# Patient Record
Sex: Male | Born: 1990 | Race: Black or African American | Hispanic: No | Marital: Single | Smoking: Never smoker
Health system: Southern US, Community
[De-identification: ages and names within clinical notes are randomized; demographics above are authoritative.]

---

## 2020-09-19 ENCOUNTER — Emergency Department (HOSPITAL_COMMUNITY): Payer: Self-pay

## 2020-09-19 ENCOUNTER — Emergency Department (HOSPITAL_COMMUNITY)
Admission: EM | Admit: 2020-09-19 | Discharge: 2020-09-19 | Disposition: A | Discharge status: Home / Self Care | Payer: Self-pay | Attending: Emergency Medicine | Admitting: Emergency Medicine

## 2020-09-19 ENCOUNTER — Encounter (HOSPITAL_COMMUNITY): Payer: Self-pay | Admitting: Emergency Medicine

## 2020-09-19 ENCOUNTER — Other Ambulatory Visit: Payer: Self-pay

## 2020-09-19 DIAGNOSIS — S53114A Anterior dislocation of right ulnohumeral joint, initial encounter: Secondary | ICD-10-CM | POA: Insufficient documentation

## 2020-09-19 DIAGNOSIS — Z20822 Contact with and (suspected) exposure to covid-19: Secondary | ICD-10-CM | POA: Insufficient documentation

## 2020-09-19 DIAGNOSIS — S42401A Unspecified fracture of lower end of right humerus, initial encounter for closed fracture: Secondary | ICD-10-CM

## 2020-09-19 DIAGNOSIS — S53104A Unspecified dislocation of right ulnohumeral joint, initial encounter: Secondary | ICD-10-CM

## 2020-09-19 DIAGNOSIS — S52121A Displaced fracture of head of right radius, initial encounter for closed fracture: Secondary | ICD-10-CM | POA: Insufficient documentation

## 2020-09-19 LAB — CBC WITH DIFFERENTIAL/PLATELET
Abs Immature Granulocytes: 0.04 10*3/uL (ref 0.00–0.07)
Basophils Absolute: 0 10*3/uL (ref 0.0–0.1)
Basophils Relative: 0 %
Eosinophils Absolute: 0.1 10*3/uL (ref 0.0–0.5)
Eosinophils Relative: 1 %
HCT: 46.5 % (ref 39.0–52.0)
Hemoglobin: 15.4 g/dL (ref 13.0–17.0)
Immature Granulocytes: 0 %
Lymphocytes Relative: 17 %
Lymphs Abs: 2.1 10*3/uL (ref 0.7–4.0)
MCH: 29.6 pg (ref 26.0–34.0)
MCHC: 33.1 g/dL (ref 30.0–36.0)
MCV: 89.3 fL (ref 80.0–100.0)
Monocytes Absolute: 0.8 10*3/uL (ref 0.1–1.0)
Monocytes Relative: 6 %
Neutro Abs: 9.3 10*3/uL — ABNORMAL HIGH (ref 1.7–7.7)
Neutrophils Relative %: 76 %
Platelets: 327 10*3/uL (ref 150–400)
RBC: 5.21 MIL/uL (ref 4.22–5.81)
RDW: 13.8 % (ref 11.5–15.5)
WBC: 12.3 10*3/uL — ABNORMAL HIGH (ref 4.0–10.5)
nRBC: 0 % (ref 0.0–0.2)

## 2020-09-19 LAB — BASIC METABOLIC PANEL
Anion gap: 9 (ref 5–15)
BUN: 14 mg/dL (ref 6–20)
CO2: 27 mmol/L (ref 22–32)
Calcium: 9.8 mg/dL (ref 8.9–10.3)
Chloride: 103 mmol/L (ref 98–111)
Creatinine, Ser: 0.95 mg/dL (ref 0.61–1.24)
GFR, Estimated: >60 mL/min (ref >60)
Glucose, Bld: 89 mg/dL (ref 70–99)
Potassium: 3.9 mmol/L (ref 3.5–5.1)
Sodium: 139 mmol/L (ref 135–145)

## 2020-09-19 LAB — RESP PANEL BY RT-PCR (FLU A&B, COVID) ARPGX2
Influenza A by PCR: NEGATIVE
Influenza B by PCR: NEGATIVE
SARS Coronavirus 2 by RT PCR: NEGATIVE

## 2020-09-19 MED ORDER — PROPOFOL 10 MG/ML IV BOLUS
0.5000 mg/kg | Freq: Once | INTRAVENOUS | Status: DC
  Filled 2020-09-19: qty 20

## 2020-09-19 MED ORDER — MORPHINE SULFATE (PF) 4 MG/ML IV SOLN
4.0000 mg | Freq: Once | INTRAVENOUS | Status: AC
  Administered 2020-09-19: 4 mg via INTRAVENOUS
  Filled 2020-09-19: qty 1

## 2020-09-19 MED ORDER — OXYCODONE-ACETAMINOPHEN 5-325 MG PO TABS
1.0000 | ORAL_TABLET | Freq: Once | ORAL | Status: AC
Start: 2020-09-19 — End: 2020-09-19
  Administered 2020-09-19: 1 via ORAL
  Filled 2020-09-19: qty 1

## 2020-09-19 MED ORDER — PROPOFOL 10 MG/ML IV BOLUS
INTRAVENOUS | Status: AC | PRN
  Administered 2020-09-19: 50 mg via INTRAVENOUS

## 2020-09-19 MED ORDER — PROPOFOL 10 MG/ML IV BOLUS
0.5000 mg/kg | Freq: Once | INTRAVENOUS | Status: AC
  Administered 2020-09-19: 48.8 mg via INTRAVENOUS
  Filled 2020-09-19: qty 20

## 2020-09-19 MED ORDER — HYDROCODONE-ACETAMINOPHEN 5-325 MG PO TABS: 2.0000 | ORAL_TABLET | ORAL | 0 refills | Status: DC | PRN
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
## 2020-09-19 NOTE — ED Notes (Signed)
Propofol wasted with Karleen Hampshire, RN.

## 2020-09-19 NOTE — ED Notes (Signed)
Dr Rodena Medin, RRT, RN x 2 present for conscious sedation and right elbow reduction again. Time out completed. Pt gives consent again. Suction prepared, pt on cardiac and co2 monitor. Code cart and airway cart at bedside.
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
## 2020-09-19 NOTE — Progress Notes (Signed)
Orthopedic Tech Progress Note Patient Details:  Leopold Smyers [birth year] [id number]  Ortho Devices Type of Ortho Device: Shoulder immobilizer,Post (long arm) splint Ortho Device/Splint Location: RUE Ortho Device/Splint Interventions: Ordered,Application   Post Interventions Patient Tolerated: Well Instructions Provided: Adjustment of device,Care of device   Maurene Capes 09/19/2020, 6:29 PM

## 2020-09-19 NOTE — Discharge Instructions (Addendum)
Return for any problem.  ?

## 2020-09-24 ENCOUNTER — Other Ambulatory Visit: Payer: Self-pay

## 2020-09-24 ENCOUNTER — Emergency Department (HOSPITAL_COMMUNITY)
Admission: EM | Admit: 2020-09-24 | Discharge: 2020-09-24 | Disposition: A | Discharge status: Home / Self Care | Payer: Self-pay | Attending: Emergency Medicine | Admitting: Emergency Medicine

## 2020-09-24 ENCOUNTER — Emergency Department (HOSPITAL_COMMUNITY): Payer: Self-pay

## 2020-09-24 ENCOUNTER — Encounter (HOSPITAL_COMMUNITY): Payer: Self-pay

## 2020-09-24 DIAGNOSIS — S53104D Unspecified dislocation of right ulnohumeral joint, subsequent encounter: Secondary | ICD-10-CM | POA: Insufficient documentation

## 2020-09-24 DIAGNOSIS — Z4789 Encounter for other orthopedic aftercare: Secondary | ICD-10-CM | POA: Insufficient documentation

## 2020-09-24 DIAGNOSIS — X58XXXD Exposure to other specified factors, subsequent encounter: Secondary | ICD-10-CM | POA: Insufficient documentation
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
## 2020-09-24 NOTE — ED Notes (Signed)
Called ortho tech for long arm splint.

## 2020-09-24 NOTE — Progress Notes (Signed)
Orthopedic Tech Progress Note Patient Details:  Cory Peterson 12-01-1990 [id number]  Ortho Devices Type of Ortho Device: Long arm splint Ortho Device/Splint Location: Right Arm Ortho Device/Splint Interventions: Application   Post Interventions Patient Tolerated: Well   [name] [name] 09/24/2020, 1:43 PM

## 2020-09-24 NOTE — ED Triage Notes (Signed)
Pt states he removed his splint to right arm last night due to hurting him and don't follow up with orthopedist until Monday and needs splint re-applied.

## 2020-09-24 NOTE — Discharge Instructions (Addendum)
Follow up with Dr. [name]

## 2020-10-07 ENCOUNTER — Encounter (HOSPITAL_COMMUNITY): Payer: Self-pay | Admitting: Emergency Medicine

## 2020-10-07 ENCOUNTER — Other Ambulatory Visit: Payer: Self-pay

## 2020-10-07 ENCOUNTER — Emergency Department (HOSPITAL_COMMUNITY): Payer: Self-pay

## 2020-10-07 ENCOUNTER — Emergency Department (HOSPITAL_COMMUNITY)
Admission: EM | Admit: 2020-10-07 | Discharge: 2020-10-07 | Disposition: A | Discharge status: Home / Self Care | Payer: Self-pay | Attending: Emergency Medicine | Admitting: Emergency Medicine

## 2020-10-07 DIAGNOSIS — X500XXD Overexertion from strenuous movement or load, subsequent encounter: Secondary | ICD-10-CM | POA: Insufficient documentation

## 2020-10-07 DIAGNOSIS — S52181K Other fracture of upper end of right radius, subsequent encounter for closed fracture with nonunion: Secondary | ICD-10-CM

## 2020-10-07 DIAGNOSIS — S52121K Displaced fracture of head of right radius, subsequent encounter for closed fracture with nonunion: Secondary | ICD-10-CM | POA: Insufficient documentation

## 2020-10-07 NOTE — ED Notes (Signed)
An After Visit Summary was printed and given to the patient. Discharge instructions given and no further questions at this time.  Pt educated to go straight to Dr. Magdalene Patricia office from this ED. Pt verbalizes understanding. Harolyn Rutherford, PA in room, also informing pt he needs to go straight to Dr. Magdalene Patricia office.

## 2020-10-07 NOTE — Progress Notes (Signed)
Unable to reach patient via phone.  Left a detailed message on machine with instructions for DOS.  DUE TO COVID-19 ONLY ONE VISITOR IS ALLOWED TO COME WITH YOU AND STAY IN THE WAITING ROOM ONLY DURING PRE OP AND PROCEDURE DAY OF SURGERY.  PCP - unknown Cardiologist - n/a  Chest x-ray - n/a EKG - n/a Stress Test - n/a ECHO - n/a Cardiac Cath - n/a  STOP now taking any Aspirin (unless otherwise instructed by your surgeon), Aleve, Naproxen, Ibuprofen, Motrin, Advil, Goody's, BC's, all herbal medications, fish oil, and all vitamins.   Coronavirus Screening Covid test n/a - Ambulatory Surgery
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
## 2020-10-07 NOTE — ED Triage Notes (Signed)
Per pt, states he dislocated his right elbow weeks ago-states he thinks he re-injured it when swimming on Monday
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
## 2020-10-08 ENCOUNTER — Ambulatory Visit (HOSPITAL_COMMUNITY): Payer: Self-pay

## 2020-10-08 ENCOUNTER — Ambulatory Visit (HOSPITAL_COMMUNITY): Payer: Self-pay | Admitting: Anesthesiology

## 2020-10-08 ENCOUNTER — Encounter (HOSPITAL_COMMUNITY)
Admission: RE | Disposition: A | Discharge status: Home / Self Care | Payer: Self-pay | Source: Home / Self Care | Attending: Orthopedic Surgery

## 2020-10-08 ENCOUNTER — Encounter (HOSPITAL_COMMUNITY): Payer: Self-pay | Admitting: Orthopedic Surgery

## 2020-10-08 ENCOUNTER — Ambulatory Visit (HOSPITAL_COMMUNITY)
Admission: RE | Admit: 2020-10-08 | Discharge: 2020-10-08 | Disposition: A | Discharge status: Home / Self Care | Payer: Self-pay | Attending: Orthopedic Surgery | Admitting: Orthopedic Surgery

## 2020-10-08 ENCOUNTER — Other Ambulatory Visit: Payer: Self-pay

## 2020-10-08 DIAGNOSIS — S52041A Displaced fracture of coronoid process of right ulna, initial encounter for closed fracture: Secondary | ICD-10-CM | POA: Insufficient documentation

## 2020-10-08 DIAGNOSIS — Z419 Encounter for procedure for purposes other than remedying health state, unspecified: Secondary | ICD-10-CM

## 2020-10-08 DIAGNOSIS — D62 Acute posthemorrhagic anemia: Secondary | ICD-10-CM | POA: Insufficient documentation

## 2020-10-08 DIAGNOSIS — T148XXA Other injury of unspecified body region, initial encounter: Secondary | ICD-10-CM

## 2020-10-08 DIAGNOSIS — S52131A Displaced fracture of neck of right radius, initial encounter for closed fracture: Secondary | ICD-10-CM | POA: Insufficient documentation

## 2020-10-08 DIAGNOSIS — X58XXXA Exposure to other specified factors, initial encounter: Secondary | ICD-10-CM | POA: Insufficient documentation

## 2020-10-08 HISTORY — PX: RADIAL HEAD ARTHROPLASTY: SHX6044

## 2020-10-08 LAB — COMPREHENSIVE METABOLIC PANEL
ALT: 36 U/L (ref 0–44)
AST: 30 U/L (ref 15–41)
Albumin: 4.1 g/dL (ref 3.5–5.0)
Alkaline Phosphatase: 93 U/L (ref 38–126)
Anion gap: 8 (ref 5–15)
BUN: 10 mg/dL (ref 6–20)
CO2: 26 mmol/L (ref 22–32)
Calcium: 9.6 mg/dL (ref 8.9–10.3)
Chloride: 105 mmol/L (ref 98–111)
Creatinine, Ser: 0.86 mg/dL (ref 0.61–1.24)
GFR, Estimated: >60 mL/min (ref >60)
Glucose, Bld: 92 mg/dL (ref 70–99)
Potassium: 3.7 mmol/L (ref 3.5–5.1)
Sodium: 139 mmol/L (ref 135–145)
Total Bilirubin: 1 mg/dL (ref 0.3–1.2)
Total Protein: 7.3 g/dL (ref 6.5–8.1)

## 2020-10-08 LAB — CBC WITH DIFFERENTIAL/PLATELET
Abs Immature Granulocytes: 0.08 10*3/uL — ABNORMAL HIGH (ref 0.00–0.07)
Basophils Absolute: 0 10*3/uL (ref 0.0–0.1)
Basophils Relative: 1 %
Eosinophils Absolute: 0.1 10*3/uL (ref 0.0–0.5)
Eosinophils Relative: 1 %
HCT: 42 % (ref 39.0–52.0)
Hemoglobin: 14.1 g/dL (ref 13.0–17.0)
Immature Granulocytes: 1 %
Lymphocytes Relative: 30 %
Lymphs Abs: 2.4 10*3/uL (ref 0.7–4.0)
MCH: 29.5 pg (ref 26.0–34.0)
MCHC: 33.6 g/dL (ref 30.0–36.0)
MCV: 87.9 fL (ref 80.0–100.0)
Monocytes Absolute: 0.5 10*3/uL (ref 0.1–1.0)
Monocytes Relative: 6 %
Neutro Abs: 5 10*3/uL (ref 1.7–7.7)
Neutrophils Relative %: 61 %
Platelets: 388 10*3/uL (ref 150–400)
RBC: 4.78 MIL/uL (ref 4.22–5.81)
RDW: 13.2 % (ref 11.5–15.5)
WBC: 8.1 10*3/uL (ref 4.0–10.5)
nRBC: 0 % (ref 0.0–0.2)

## 2020-10-08 LAB — RAPID URINE DRUG SCREEN, HOSP PERFORMED
Amphetamines: NOT DETECTED
Barbiturates: NOT DETECTED
Benzodiazepines: NOT DETECTED
Cocaine: NOT DETECTED
Opiates: NOT DETECTED
Tetrahydrocannabinol: POSITIVE — AB

## 2020-10-08 LAB — VITAMIN D 25 HYDROXY (VIT D DEFICIENCY, FRACTURES): Vit D, 25-Hydroxy: 11.58 ng/mL — ABNORMAL LOW (ref 30–100)

## 2020-10-08 SURGERY — ARTHROPLASTY, RADIUS, HEAD
Anesthesia: Regional | Laterality: Right

## 2020-10-08 MED ORDER — ONDANSETRON HCL 4 MG/2ML IJ SOLN
INTRAMUSCULAR | Status: DC | PRN
  Administered 2020-10-08: 4 mg via INTRAVENOUS

## 2020-10-08 MED ORDER — FENTANYL CITRATE (PF) 100 MCG/2ML IJ SOLN
INTRAMUSCULAR | Status: AC
  Administered 2020-10-08: 100 ug via INTRAVENOUS
  Filled 2020-10-08: qty 2

## 2020-10-08 MED ORDER — PHENYLEPHRINE 40 MCG/ML (10ML) SYRINGE FOR IV PUSH (FOR BLOOD PRESSURE SUPPORT)
PREFILLED_SYRINGE | INTRAVENOUS | Status: DC | PRN
  Administered 2020-10-08 (×3): 80 ug via INTRAVENOUS

## 2020-10-08 MED ORDER — OXYCODONE-ACETAMINOPHEN 5-325 MG PO TABS: 1.0000 | ORAL_TABLET | Freq: Four times a day (QID) | ORAL | 0 refills | Status: AC | PRN

## 2020-10-08 MED ORDER — PROPOFOL 10 MG/ML IV BOLUS
INTRAVENOUS | Status: DC | PRN
  Administered 2020-10-08: 200 mg via INTRAVENOUS

## 2020-10-08 MED ORDER — ACETAMINOPHEN 500 MG PO TABS
1000.0000 mg | ORAL_TABLET | Freq: Once | ORAL | Status: AC
  Administered 2020-10-08: 1000 mg via ORAL
  Filled 2020-10-08: qty 2

## 2020-10-08 MED ORDER — MIDAZOLAM HCL 2 MG/2ML IJ SOLN: 2.0000 mg | Freq: Once | INTRAMUSCULAR | Status: AC

## 2020-10-08 MED ORDER — OXYCODONE HCL 5 MG PO TABS: 5.0000 mg | ORAL_TABLET | Freq: Once | ORAL | Status: DC | PRN

## 2020-10-08 MED ORDER — DEXAMETHASONE SODIUM PHOSPHATE 10 MG/ML IJ SOLN
INTRAMUSCULAR | Status: DC | PRN
  Administered 2020-10-08: 10 mg

## 2020-10-08 MED ORDER — CEFAZOLIN SODIUM-DEXTROSE 2-4 GM/100ML-% IV SOLN
2.0000 g | INTRAVENOUS | Status: AC
  Administered 2020-10-08: 2 g via INTRAVENOUS

## 2020-10-08 MED ORDER — ACETAMINOPHEN 500 MG PO TABS: 500.0000 mg | ORAL_TABLET | Freq: Two times a day (BID) | ORAL | 0 refills | Status: AC

## 2020-10-08 MED ORDER — CHLORHEXIDINE GLUCONATE 0.12 % MT SOLN
OROMUCOSAL | Status: AC
  Administered 2020-10-08: 15 mL via OROMUCOSAL
  Filled 2020-10-08: qty 15

## 2020-10-08 MED ORDER — CHLORHEXIDINE GLUCONATE 0.12 % MT SOLN: 15.0000 mL | Freq: Once | OROMUCOSAL | Status: AC

## 2020-10-08 MED ORDER — SUGAMMADEX SODIUM 200 MG/2ML IV SOLN
INTRAVENOUS | Status: DC | PRN
  Administered 2020-10-08: 200 mg via INTRAVENOUS

## 2020-10-08 MED ORDER — PROMETHAZINE HCL 25 MG/ML IJ SOLN: 6.2500 mg | INTRAMUSCULAR | Status: DC | PRN

## 2020-10-08 MED ORDER — FENTANYL CITRATE (PF) 250 MCG/5ML IJ SOLN
INTRAMUSCULAR | Status: DC | PRN
  Administered 2020-10-08: 100 ug via INTRAVENOUS

## 2020-10-08 MED ORDER — MEPERIDINE HCL 25 MG/ML IJ SOLN: 6.2500 mg | INTRAMUSCULAR | Status: DC | PRN

## 2020-10-08 MED ORDER — HYDROMORPHONE HCL 1 MG/ML IJ SOLN: 0.2500 mg | INTRAMUSCULAR | Status: DC | PRN

## 2020-10-08 MED ORDER — KETOROLAC TROMETHAMINE 10 MG PO TABS: 10.0000 mg | ORAL_TABLET | Freq: Four times a day (QID) | ORAL | 0 refills | Status: AC | PRN

## 2020-10-08 MED ORDER — ROPIVACAINE HCL 5 MG/ML IJ SOLN
INTRAMUSCULAR | Status: DC | PRN
  Administered 2020-10-08: 25 mL via PERINEURAL

## 2020-10-08 MED ORDER — MIDAZOLAM HCL 2 MG/2ML IJ SOLN
INTRAMUSCULAR | Status: AC
  Administered 2020-10-08: 2 mg via INTRAVENOUS
  Filled 2020-10-08: qty 2

## 2020-10-08 MED ORDER — LIDOCAINE 2% (20 MG/ML) 5 ML SYRINGE
INTRAMUSCULAR | Status: DC | PRN
  Administered 2020-10-08: 60 mg via INTRAVENOUS

## 2020-10-08 MED ORDER — EPHEDRINE SULFATE-NACL 50-0.9 MG/10ML-% IV SOSY
PREFILLED_SYRINGE | INTRAVENOUS | Status: DC | PRN
  Administered 2020-10-08 (×2): 5 mg via INTRAVENOUS

## 2020-10-08 MED ORDER — ROCURONIUM BROMIDE 10 MG/ML (PF) SYRINGE
PREFILLED_SYRINGE | INTRAVENOUS | Status: DC | PRN
  Administered 2020-10-08: 80 mg via INTRAVENOUS
  Administered 2020-10-08: 20 mg via INTRAVENOUS

## 2020-10-08 MED ORDER — FENTANYL CITRATE (PF) 100 MCG/2ML IJ SOLN
100.0000 ug | Freq: Once | INTRAMUSCULAR | Status: AC
Start: 2020-10-08 — End: 2020-10-08

## 2020-10-08 MED ORDER — MIDAZOLAM HCL 2 MG/2ML IJ SOLN
INTRAMUSCULAR | Status: DC | PRN
  Administered 2020-10-08: 2 mg via INTRAVENOUS

## 2020-10-08 MED ORDER — LACTATED RINGERS IV SOLN: INTRAVENOUS | Status: DC

## 2020-10-08 MED ORDER — PROPOFOL 10 MG/ML IV BOLUS
INTRAVENOUS | Status: AC
  Filled 2020-10-08: qty 20

## 2020-10-08 MED ORDER — OXYCODONE HCL 5 MG/5ML PO SOLN: 5.0000 mg | Freq: Once | ORAL | Status: DC | PRN

## 2020-10-08 MED ORDER — 0.9 % SODIUM CHLORIDE (POUR BTL) OPTIME
TOPICAL | Status: DC | PRN
  Administered 2020-10-08: 1000 mL

## 2020-10-08 MED ORDER — KETOROLAC TROMETHAMINE 30 MG/ML IJ SOLN: 30.0000 mg | Freq: Once | INTRAMUSCULAR | Status: DC | PRN

## 2020-10-08 MED ORDER — CEFAZOLIN SODIUM-DEXTROSE 2-4 GM/100ML-% IV SOLN
INTRAVENOUS | Status: AC
  Filled 2020-10-08: qty 100

## 2020-10-08 MED ORDER — METHOCARBAMOL 500 MG PO TABS: 500.0000 mg | ORAL_TABLET | Freq: Four times a day (QID) | ORAL | 0 refills | Status: AC | PRN

## 2020-10-08 MED ORDER — MIDAZOLAM HCL 2 MG/2ML IJ SOLN
INTRAMUSCULAR | Status: AC
  Filled 2020-10-08: qty 2

## 2020-10-08 MED ORDER — FENTANYL CITRATE (PF) 250 MCG/5ML IJ SOLN
INTRAMUSCULAR | Status: AC
  Filled 2020-10-08: qty 5

## 2020-10-08 MED ORDER — ORAL CARE MOUTH RINSE: 15.0000 mL | Freq: Once | OROMUCOSAL | Status: AC

## 2020-10-08 MED ORDER — ONDANSETRON 4 MG PO TBDP: 4.0000 mg | ORAL_TABLET | Freq: Three times a day (TID) | ORAL | 0 refills | Status: AC | PRN

## 2020-10-08 SURGICAL SUPPLY — 79 items
BENZOIN TINCTURE PRP APPL 2/3 (GAUZE/BANDAGES/DRESSINGS) IMPLANT
BLADE AVERAGE 25X9 (BLADE) ×2 IMPLANT
BLADE CLIPPER SURG (BLADE) ×2 IMPLANT
BLADE SURG 10 STRL SS (BLADE) IMPLANT
BNDG COHESIVE 4X5 TAN STRL (GAUZE/BANDAGES/DRESSINGS) ×2 IMPLANT
BNDG ELASTIC 3X5.8 VLCR STR LF (GAUZE/BANDAGES/DRESSINGS) ×2 IMPLANT
BNDG ELASTIC 4X5.8 VLCR STR LF (GAUZE/BANDAGES/DRESSINGS) ×2 IMPLANT
BNDG ELASTIC 6X5.8 VLCR STR LF (GAUZE/BANDAGES/DRESSINGS) ×2 IMPLANT
BNDG ESMARK 4X9 LF (GAUZE/BANDAGES/DRESSINGS) ×2 IMPLANT
BNDG GAUZE ELAST 4 BULKY (GAUZE/BANDAGES/DRESSINGS) ×2 IMPLANT
BRUSH SCRUB EZ PLAIN DRY (MISCELLANEOUS) ×4 IMPLANT
CLEANER TIP ELECTROSURG 2X2 (MISCELLANEOUS) ×2 IMPLANT
COVER SURGICAL LIGHT HANDLE (MISCELLANEOUS) ×4 IMPLANT
COVER WAND RF STERILE (DRAPES) ×2 IMPLANT
CUFF TOURN SGL QUICK 18X4 (TOURNIQUET CUFF) IMPLANT
CUFF TOURN SGL QUICK 24 (TOURNIQUET CUFF)
CUFF TRNQT CYL 24X4X16.5-23 (TOURNIQUET CUFF) IMPLANT
DECANTER SPIKE VIAL GLASS SM (MISCELLANEOUS) IMPLANT
DRAPE C-ARM 42X72 X-RAY (DRAPES) IMPLANT
DRAPE C-ARMOR (DRAPES) ×2 IMPLANT
DRAPE INCISE IOBAN 66X45 STRL (DRAPES) IMPLANT
DRAPE U-SHAPE 47X51 STRL (DRAPES) ×2 IMPLANT
DRSG ADAPTIC 3X8 NADH LF (GAUZE/BANDAGES/DRESSINGS) IMPLANT
DRSG EMULSION OIL 3X3 NADH (GAUZE/BANDAGES/DRESSINGS) ×2 IMPLANT
ELECT REM PT RETURN 9FT ADLT (ELECTROSURGICAL) ×2
ELECTRODE REM PT RTRN 9FT ADLT (ELECTROSURGICAL) ×1 IMPLANT
FACESHIELD WRAPAROUND (MASK) IMPLANT
GAUZE SPONGE 4X4 12PLY STRL (GAUZE/BANDAGES/DRESSINGS) ×2 IMPLANT
GAUZE XEROFORM 1X8 LF (GAUZE/BANDAGES/DRESSINGS) ×2 IMPLANT
GAUZE XEROFORM 5X9 LF (GAUZE/BANDAGES/DRESSINGS) ×2 IMPLANT
GLOVE BIO SURGEON STRL SZ7.5 (GLOVE) ×2 IMPLANT
GLOVE BIO SURGEON STRL SZ8 (GLOVE) ×2 IMPLANT
GLOVE BIOGEL PI IND STRL 7.5 (GLOVE) ×1 IMPLANT
GLOVE BIOGEL PI INDICATOR 7.5 (GLOVE) ×1
GLOVE SRG 8 PF TXTR STRL LF DI (GLOVE) ×1 IMPLANT
GLOVE SURG UNDER POLY LF SZ8 (GLOVE) ×1
GOWN STRL REUS W/ TWL LRG LVL3 (GOWN DISPOSABLE) ×2 IMPLANT
GOWN STRL REUS W/ TWL XL LVL3 (GOWN DISPOSABLE) ×1 IMPLANT
GOWN STRL REUS W/TWL LRG LVL3 (GOWN DISPOSABLE) ×2
GOWN STRL REUS W/TWL XL LVL3 (GOWN DISPOSABLE) ×1
HEAD RADIAL 10X20 (Head) ×2 IMPLANT
IMPL HEAD (Orthopedic Implant) ×1 IMPLANT
IMPLANT HEAD (Orthopedic Implant) ×2 IMPLANT
KIT BASIN OR (CUSTOM PROCEDURE TRAY) ×2 IMPLANT
KIT TURNOVER KIT B (KITS) ×2 IMPLANT
MANIFOLD NEPTUNE II (INSTRUMENTS) ×2 IMPLANT
NEEDLE 1/2 CIR CATGUT .05X1.09 (NEEDLE) ×2 IMPLANT
NS IRRIG 1000ML POUR BTL (IV SOLUTION) ×2 IMPLANT
PACK ORTHO EXTREMITY (CUSTOM PROCEDURE TRAY) ×2 IMPLANT
PAD ARMBOARD 7.5X6 YLW CONV (MISCELLANEOUS) ×4 IMPLANT
PAD CAST 4YDX4 CTTN HI CHSV (CAST SUPPLIES) ×2 IMPLANT
PADDING CAST COTTON 4X4 STRL (CAST SUPPLIES) ×2
PASSER SUT SWANSON 36MM LOOP (INSTRUMENTS) ×2 IMPLANT
SLING ARM FOAM STRAP LRG (SOFTGOODS) ×2 IMPLANT
SPLINT PLASTER CAST XFAST 5X30 (CAST SUPPLIES) ×1 IMPLANT
SPLINT PLASTER XFAST SET 5X30 (CAST SUPPLIES) ×1
SPONGE LAP 18X18 RF (DISPOSABLE) ×4 IMPLANT
STAPLER VISISTAT 35W (STAPLE) ×2 IMPLANT
STEM IMPLANT W SCREW (Stem) ×2 IMPLANT
STRIP CLOSURE SKIN 1/2X4 (GAUZE/BANDAGES/DRESSINGS) IMPLANT
SUCTION FRAZIER HANDLE 10FR (MISCELLANEOUS) ×1
SUCTION TUBE FRAZIER 10FR DISP (MISCELLANEOUS) ×1 IMPLANT
SUT ETHILON 3 0 FSL (SUTURE) ×4 IMPLANT
SUT FIBERWIRE #2 38 T-5 BLUE (SUTURE) ×4
SUT PDS AB 2-0 CT1 27 (SUTURE) IMPLANT
SUT PROLENE 3 0 PS 2 (SUTURE) ×4 IMPLANT
SUT VIC AB 0 CT1 27 (SUTURE) ×1
SUT VIC AB 0 CT1 27XBRD ANBCTR (SUTURE) ×1 IMPLANT
SUT VIC AB 2-0 CT1 27 (SUTURE) ×2
SUT VIC AB 2-0 CT1 TAPERPNT 27 (SUTURE) ×2 IMPLANT
SUT VIC AB 2-0 CT3 27 (SUTURE) IMPLANT
SUTURE FIBERWR #2 38 T-5 BLUE (SUTURE) ×2 IMPLANT
SYR CONTROL 10ML LL (SYRINGE) ×2 IMPLANT
TOWEL GREEN STERILE (TOWEL DISPOSABLE) ×4 IMPLANT
TOWEL GREEN STERILE FF (TOWEL DISPOSABLE) IMPLANT
TUBE CONNECTING 12X1/4 (SUCTIONS) ×2 IMPLANT
UNDERPAD 30X36 HEAVY ABSORB (UNDERPADS AND DIAPERS) ×2 IMPLANT
WATER STERILE IRR 1000ML POUR (IV SOLUTION) ×2 IMPLANT
YANKAUER SUCT BULB TIP NO VENT (SUCTIONS) ×2 IMPLANT

## 2020-10-08 NOTE — Transfer of Care (Signed)
Immediate Anesthesia Transfer of Care Note  Patient: Cory Peterson  Procedure(s) Performed: RADIAL HEAD ARTHROPLASTY (Right )  Patient Location: PACU  Anesthesia Type:GA combined with regional for post-op pain  Level of Consciousness: drowsy and patient cooperative  Airway & Oxygen Therapy: Patient Spontanous Breathing and Patient connected to face mask oxygen  Post-op Assessment: Report given to RN and Post -op Vital signs reviewed and stable  Post vital signs: Reviewed and stable  Last Vitals:  Vitals Value Taken Time  BP 147/88   Temp    Pulse 99   Resp 15   SpO2 99     Last Pain:  Vitals:   10/08/20 1209  TempSrc:   PainSc: 0-No pain         Complications: No complications documented.
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
## 2020-10-08 NOTE — Anesthesia Procedure Notes (Addendum)
Procedure Name: Intubation Date/Time: 10/08/2020 3:49 PM Performed by: Shary Decamp, CRNA Pre-anesthesia Checklist: Patient identified, Emergency Drugs available, Suction available and Patient being monitored Patient Re-evaluated:Patient Re-evaluated prior to induction Oxygen Delivery Method: Circle System Utilized Preoxygenation: Pre-oxygenation with 100% oxygen Induction Type: IV induction Ventilation: Mask ventilation without difficulty and Oral airway inserted - appropriate to patient size Laryngoscope Size: Miller and 3 Grade View: Grade I Tube type: Oral Tube size: 8.0 mm Number of attempts: 1 Airway Equipment and Method: Stylet and Oral airway Placement Confirmation: ETT inserted through vocal cords under direct vision,  positive ETCO2 and breath sounds checked- equal and bilateral Tube secured with: Tape Dental Injury: Teeth and Oropharynx as per pre-operative assessment

## 2020-10-09 ENCOUNTER — Encounter (HOSPITAL_COMMUNITY): Payer: Self-pay | Admitting: Orthopedic Surgery

## 2020-10-12 NOTE — Anesthesia Postprocedure Evaluation (Signed)
Anesthesia Post Note  Patient: Cory Peterson  Procedure(s) Performed: RADIAL HEAD ARTHROPLASTY (Right )     Patient location during evaluation: PACU Anesthesia Type: Regional Level of consciousness: awake and alert Pain management: pain level controlled Vital Signs Assessment: post-procedure vital signs reviewed and stable Respiratory status: spontaneous breathing, nonlabored ventilation, respiratory function stable and patient connected to nasal cannula oxygen Cardiovascular status: blood pressure returned to baseline and stable Postop Assessment: no apparent nausea or vomiting Anesthetic complications: no   No complications documented.  Last Vitals:  Vitals:   10/08/20 1845 10/08/20 1900  BP: 127/78 133/87  Pulse: (!) 106 97  Resp: 10 12  Temp:  (!) 36.2 C  SpO2: 98% 100%    Last Pain:  Vitals:   10/08/20 1900  TempSrc:   PainSc: 0-No pain                 Kellin Bartling
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
